# Patient Record
Sex: Male | Born: 1960 | Race: Black or African American | Hispanic: No | Marital: Married | Smoking: Current every day smoker
Health system: Southern US, Community
[De-identification: ages and names within clinical notes are randomized; demographics above are authoritative.]

## PROBLEM LIST (undated history)

## (undated) DIAGNOSIS — N182 Chronic kidney disease, stage 2 (mild): Secondary | ICD-10-CM

## (undated) DIAGNOSIS — I10 Essential (primary) hypertension: Secondary | ICD-10-CM

## (undated) DIAGNOSIS — E119 Type 2 diabetes mellitus without complications: Secondary | ICD-10-CM

## (undated) HISTORY — PX: CYST EXCISION: SHX5701

---

## 2018-02-27 ENCOUNTER — Encounter (HOSPITAL_COMMUNITY): Payer: Self-pay | Admitting: *Deleted

## 2018-02-27 ENCOUNTER — Emergency Department (HOSPITAL_COMMUNITY)
Admission: EM | Admit: 2018-02-27 | Discharge: 2018-02-27 | Disposition: A | Discharge status: Home / Self Care | Payer: BLUE CROSS/BLUE SHIELD | Attending: Emergency Medicine | Admitting: Emergency Medicine

## 2018-02-27 ENCOUNTER — Emergency Department (HOSPITAL_COMMUNITY): Payer: BLUE CROSS/BLUE SHIELD

## 2018-02-27 ENCOUNTER — Other Ambulatory Visit: Payer: Self-pay

## 2018-02-27 DIAGNOSIS — Y999 Unspecified external cause status: Secondary | ICD-10-CM | POA: Diagnosis not present

## 2018-02-27 DIAGNOSIS — E1122 Type 2 diabetes mellitus with diabetic chronic kidney disease: Secondary | ICD-10-CM | POA: Diagnosis not present

## 2018-02-27 DIAGNOSIS — F17210 Nicotine dependence, cigarettes, uncomplicated: Secondary | ICD-10-CM | POA: Insufficient documentation

## 2018-02-27 DIAGNOSIS — Y92411 Interstate highway as the place of occurrence of the external cause: Secondary | ICD-10-CM | POA: Diagnosis not present

## 2018-02-27 DIAGNOSIS — I129 Hypertensive chronic kidney disease with stage 1 through stage 4 chronic kidney disease, or unspecified chronic kidney disease: Secondary | ICD-10-CM | POA: Diagnosis not present

## 2018-02-27 DIAGNOSIS — E11649 Type 2 diabetes mellitus with hypoglycemia without coma: Secondary | ICD-10-CM | POA: Insufficient documentation

## 2018-02-27 DIAGNOSIS — Y9389 Activity, other specified: Secondary | ICD-10-CM | POA: Insufficient documentation

## 2018-02-27 DIAGNOSIS — E162 Hypoglycemia, unspecified: Secondary | ICD-10-CM

## 2018-02-27 DIAGNOSIS — N182 Chronic kidney disease, stage 2 (mild): Secondary | ICD-10-CM | POA: Diagnosis not present

## 2018-02-27 DIAGNOSIS — N289 Disorder of kidney and ureter, unspecified: Secondary | ICD-10-CM

## 2018-02-27 HISTORY — DX: Essential (primary) hypertension: I10

## 2018-02-27 HISTORY — DX: Chronic kidney disease, stage 2 (mild): N18.2

## 2018-02-27 HISTORY — DX: Type 2 diabetes mellitus without complications: E11.9

## 2018-02-27 LAB — CBG MONITORING, ED
GLUCOSE-CAPILLARY: 98 mg/dL (ref 70–99)
GLUCOSE-CAPILLARY: 83 mg/dL (ref 70–99)
GLUCOSE-CAPILLARY: 140 mg/dL — AB (ref 70–99)

## 2018-02-27 LAB — BASIC METABOLIC PANEL
Anion gap: 8 (ref 5–15)
BUN: 29 mg/dL — AB (ref 6–20)
CO2: 28 mmol/L (ref 22–32)
Calcium: 9.2 mg/dL (ref 8.9–10.3)
Chloride: 102 mmol/L (ref 98–111)
Creatinine, Ser: 2.25 mg/dL — ABNORMAL HIGH (ref 0.61–1.24)
GFR, EST AFRICAN AMERICAN: 36 mL/min — AB (ref >60)
GFR, EST NON AFRICAN AMERICAN: 31 mL/min — AB (ref >60)
Glucose, Bld: 92 mg/dL (ref 70–99)
POTASSIUM: 4 mmol/L (ref 3.5–5.1)
SODIUM: 138 mmol/L (ref 135–145)

## 2018-02-27 LAB — CBC WITH DIFFERENTIAL/PLATELET
BASOS ABS: 0 10*3/uL (ref 0.0–0.1)
Basophils Relative: 0 %
EOS ABS: 0.2 10*3/uL (ref 0.0–0.7)
EOS PCT: 2 %
HCT: 37.1 % — ABNORMAL LOW (ref 39.0–52.0)
HEMOGLOBIN: 12.6 g/dL — AB (ref 13.0–17.0)
LYMPHS ABS: 1.9 10*3/uL (ref 0.7–4.0)
LYMPHS PCT: 18 %
MCH: 25.4 pg — AB (ref 26.0–34.0)
MCHC: 34 g/dL (ref 30.0–36.0)
MCV: 74.6 fL — ABNORMAL LOW (ref 78.0–100.0)
Monocytes Absolute: 0.7 10*3/uL (ref 0.1–1.0)
Monocytes Relative: 7 %
NEUTROS PCT: 73 %
Neutro Abs: 8.2 10*3/uL — ABNORMAL HIGH (ref 1.7–7.7)
PLATELETS: 281 10*3/uL (ref 150–400)
RBC: 4.97 MIL/uL (ref 4.22–5.81)
RDW: 14.1 % (ref 11.5–15.5)
WBC: 11.1 10*3/uL — AB (ref 4.0–10.5)

## 2018-02-27 NOTE — Discharge Instructions (Addendum)
Follow-up with your doctor and follow with the nephrologist as planned.  You may need to decrease your insulin dose.  Your blood pressure is also high and will need to be followed
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
## 2018-02-27 NOTE — ED Notes (Signed)
Pt given snack (graham crackers and peanut butter). Meal tray unavailable from cafeteria at this time per nutrition. Meal tray will be brought up as soon as possible.
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
## 2019-07-18 LAB — HEMOGLOBIN A1C: Hemoglobin A1C: 11.3

## 2019-08-20 ENCOUNTER — Encounter: Payer: Self-pay | Admitting: "Endocrinology

## 2019-08-20 ENCOUNTER — Ambulatory Visit (INDEPENDENT_AMBULATORY_CARE_PROVIDER_SITE_OTHER): Payer: BC Managed Care – PPO | Admitting: "Endocrinology

## 2019-08-20 ENCOUNTER — Other Ambulatory Visit: Payer: Self-pay

## 2019-08-20 VITALS — BP 175/93 | HR 83 | Ht 72.0 in | Wt 215.0 lb

## 2019-08-20 DIAGNOSIS — Z794 Long term (current) use of insulin: Secondary | ICD-10-CM

## 2019-08-20 DIAGNOSIS — E1121 Type 2 diabetes mellitus with diabetic nephropathy: Secondary | ICD-10-CM | POA: Diagnosis not present

## 2019-08-20 DIAGNOSIS — N1831 Chronic kidney disease, stage 3a: Secondary | ICD-10-CM

## 2019-08-20 DIAGNOSIS — E782 Mixed hyperlipidemia: Secondary | ICD-10-CM | POA: Diagnosis not present

## 2019-08-20 DIAGNOSIS — I10 Essential (primary) hypertension: Secondary | ICD-10-CM | POA: Diagnosis not present

## 2019-08-20 MED ORDER — ACCU-CHEK GUIDE VI STRP: ORAL_STRIP | 2 refills | Status: AC

## 2019-08-20 MED ORDER — ACCU-CHEK GUIDE W/DEVICE KIT: 1.0000 | PACK | 0 refills | Status: AC

## 2019-08-20 NOTE — Progress Notes (Signed)
Endocrinology Consult Note       08/20/2019, 4:41 PM   Subjective:    Patient ID: Donald Pitts, male    DOB: Dec 05, 1960.  Jessen Siegman is being seen in consultation for management of currently uncontrolled symptomatic diabetes requested by  Patient, No Pcp Per.   Past Medical History:  Diagnosis Date  . Diabetes mellitus without complication (HCC)   . Hypertension   . 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 CYST EXCISION      Social History   Socioeconomic History  . Marital status: Married    Spouse name: Not on file  . Number of children: Not on file  . Years of education: Not on file  . Highest education level: Not on file  Occupational History  . Not on file  Tobacco Use  . Smoking status: Current Every Day Smoker    Packs/day: 0.30    Types: Cigarettes  . Smokeless tobacco: Never Used  Substance and Sexual Activity  . Alcohol use: Yes    Alcohol/week: 7.0 standard drinks    Types: 7 Cans of beer per week    Comment: 1 beer daily  . Drug use: Never  . Sexual activity: Not on file  Other Topics Concern  . Not on file  Social History Narrative  . Not on file   Social Determinants of Health   Financial Resource Strain:   . Difficulty of Paying Living Expenses: Not on file  Food Insecurity:   . Worried About Programme researcher, broadcasting/film/video in the Last Year: Not on file  . Ran Out of Food in the Last Year: Not on file  Transportation Needs:   . Lack of Transportation (Medical): Not on file  . Lack of Transportation (Non-Medical): Not on file  Physical Activity:   . Days of Exercise per Week: Not on file  . Minutes of Exercise per Session: Not on file  Stress:   . Feeling of Stress : Not on file  Social Connections:   . Frequency of Communication with Friends and Family: Not on file  . Frequency of Social Gatherings  with Friends and Family: Not on file  . Attends Religious Services: Not on file  . Active Member of Clubs or Organizations: Not on file  . Attends Banker Meetings: Not on file  . Marital Status: Not on file    History reviewed. No pertinent family history.  Outpatient Encounter Medications as of 08/20/2019  Medication Sig  . albuterol (ACCUNEB) 0.63 MG/3ML nebulizer solution Take 1 ampule by nebulization every 6 (six) hours as needed for wheezing.  Marland Kitchen amLODipine (NORVASC) 10 MG tablet Take 10 mg by mouth daily.  . clopidogrel (PLAVIX) 75 MG tablet Take 75 mg by mouth daily.  . fexofenadine (ALLEGRA) 180 MG tablet Take 180 mg by mouth daily.  . furosemide (LASIX) 20 MG tablet Take 20 mg by mouth 2 (two) times daily.  Marland Kitchen gabapentin (NEURONTIN) 300 MG capsule Take 300 mg by mouth 3 (three) times daily.  . hydrALAZINE (APRESOLINE) 25 MG tablet  Take 25 mg by mouth 3 (three) times daily.  . Insulin Detemir (LEVEMIR FLEXTOUCH) 100 UNIT/ML Pen Inject 50 Units into the skin at bedtime.  . montelukast (SINGULAIR) 10 MG tablet Take 10 mg by mouth at bedtime.  Marland Kitchen olmesartan (BENICAR) 5 MG tablet Take by mouth daily.  . rosuvastatin (CRESTOR) 20 MG tablet Take 20 mg by mouth daily.  . Vitamin D, Ergocalciferol, (DRISDOL) 1.25 MG (50000 UNIT) CAPS capsule Take 50,000 Units by mouth every 7 (seven) days.   No facility-administered encounter medications on file as of 08/20/2019.    ALLERGIES: No Known Allergies  VACCINATION STATUS:  There is no immunization history on file for this patient.  Diabetes He presents for his initial diabetic visit. He has type 2 diabetes mellitus. Onset time: He was diagnosed at approximate age of 32 years. His disease course has been worsening. There are no hypoglycemic associated symptoms. Pertinent negatives for hypoglycemia include no confusion, headaches, pallor or seizures. Associated symptoms include polydipsia and polyuria. Pertinent negatives for  diabetes include no chest pain, no fatigue, no polyphagia and no weakness. There are no hypoglycemic complications. Symptoms are worsening. Diabetic complications include heart disease, nephropathy, PVD and retinopathy. Risk factors for coronary artery disease include diabetes mellitus, dyslipidemia, family history, male sex, hypertension, obesity, tobacco exposure and sedentary lifestyle. Current diabetic treatment includes insulin injections. His weight is fluctuating minimally. He is following a generally unhealthy diet. When asked about meal planning, he reported none. He has not had a previous visit with a dietitian. He never participates in exercise. (He did not bring any logs nor meter to review.  His recent A1c was 11.3%. ) An ACE inhibitor/angiotensin II receptor blocker is being taken. Eye exam is current.  Hyperlipidemia This is a chronic problem. The current episode started more than 1 year ago. Exacerbating diseases include chronic renal disease and diabetes. Pertinent negatives include no chest pain, myalgias or shortness of breath. Risk factors for coronary artery disease include diabetes mellitus, dyslipidemia, family history, male sex, hypertension and a sedentary lifestyle.  Hypertension This is a chronic problem. The current episode started more than 1 year ago. The problem is uncontrolled. Pertinent negatives include no chest pain, headaches, neck pain, palpitations or shortness of breath. Risk factors for coronary artery disease include diabetes mellitus, dyslipidemia, family history, obesity, male gender, sedentary lifestyle and smoking/tobacco exposure. Past treatments include angiotensin blockers. Hypertensive end-organ damage includes heart failure, PVD and retinopathy. Identifiable causes of hypertension include chronic renal disease.     Review of Systems  Constitutional: Negative for chills, fatigue, fever and unexpected weight change.  HENT: Negative for dental problem, mouth  sores and trouble swallowing.   Eyes: Negative for visual disturbance.  Respiratory: Negative for cough, choking, chest tightness, shortness of breath and wheezing.   Cardiovascular: Negative for chest pain, palpitations and leg swelling.  Gastrointestinal: Negative for abdominal distention, abdominal pain, constipation, diarrhea, nausea and vomiting.  Endocrine: Positive for polydipsia and polyuria. Negative for polyphagia.  Genitourinary: Negative for dysuria, flank pain, hematuria and urgency.  Musculoskeletal: Negative for back pain, gait problem, myalgias and neck pain.  Skin: Negative for pallor, rash and wound.  Neurological: Negative for seizures, syncope, weakness, numbness and headaches.  Psychiatric/Behavioral: Negative for confusion and dysphoric mood.    Objective:    BP (!) 175/93   Pulse 83   Ht 6' (1.829 m)   Wt 215 lb (97.5 kg)   BMI 29.16 kg/m   Wt Readings from Last 3 Encounters:  08/20/19 215 lb (97.5 kg)  02/27/18 210 lb (95.3 kg)     Physical Exam Constitutional:      General: He is not in acute distress.    Appearance: He is well-developed.  HENT:     Head: Normocephalic and atraumatic.  Neck:     Thyroid: No thyromegaly.     Trachea: No tracheal deviation.  Cardiovascular:     Rate and Rhythm: Normal rate.     Pulses:          Dorsalis pedis pulses are 0 on the right side and 0 on the left side.       Posterior tibial pulses are 0 on the right side and 0 on the left side.     Heart sounds: S1 normal and S2 normal. No murmur. No gallop.   Pulmonary:     Effort: Pulmonary effort is normal. No respiratory distress.     Breath sounds: No wheezing.  Abdominal:     General: Bowel sounds are normal. There is no distension.     Palpations: Abdomen is soft.     Tenderness: There is no abdominal tenderness. There is no guarding.  Musculoskeletal:        General: Swelling present.     Right shoulder: No swelling or deformity.     Cervical back: Normal  range of motion and neck supple.     Right lower leg: Edema present.     Left lower leg: Edema present.  Skin:    General: Skin is warm and dry.     Findings: No rash.     Nails: There is no clubbing.  Neurological:     Mental Status: He is alert and oriented to person, place, and time.     Cranial Nerves: No cranial nerve deficit.     Sensory: No sensory deficit.     Gait: Gait normal.     Deep Tendon Reflexes: Reflexes are normal and symmetric.  Psychiatric:        Speech: Speech normal.        Behavior: Behavior normal. Behavior is cooperative.        Thought Content: Thought content normal.        Judgment: Judgment normal.      CMP ( most recent) CMP     Component Value Date/Time   NA 138 02/27/2018 1646   K 4.0 02/27/2018 1646   CL 102 02/27/2018 1646   CO2 28 02/27/2018 1646   GLUCOSE 92 02/27/2018 1646   BUN 29 (H) 02/27/2018 1646   CREATININE 2.25 (H) 02/27/2018 1646   CALCIUM 9.2 02/27/2018 1646   GFRNONAA 31 (L) 02/27/2018 1646   GFRAA 36 (L) 02/27/2018 1646     Diabetic Labs (most recent): Lab Results  Component Value Date   HGBA1C 11.3 07/18/2019    Assessment & Plan:   1. Type 2 diabetes mellitus with stage 3a chronic kidney disease, with long-term current use of insulin (HCC)   - Eliseo Withers has currently uncontrolled symptomatic type 2 DM since  59 years of age,  with most recent A1c of 11.3 %. Recent labs reviewed. - I had a long discussion with him about the progressive nature of diabetes and the pathology behind its complications. -his diabetes is complicated by CKD, CHF, peripheral arterial disease, retinopathy, sedentary life, smoking and he remains at a high risk for more acute and chronic complications which include CAD, CVA, CKD, retinopathy, and neuropathy. These are all discussed in detail with  him.  - I have counseled him on diet  and weight management  by adopting a carbohydrate restricted/protein rich diet. Patient is encouraged to  switch to  unprocessed or minimally processed     complex starch and increased protein intake (animal or plant source), fruits, and vegetables. -  he is advised to stick to a routine mealtimes to eat 3 meals  a day and avoid unnecessary snacks ( to snack only to correct hypoglycemia).   - he admits that there is a room for improvement in his food and drink choices. - Suggestion is made for him to avoid simple carbohydrates  from his diet including Cakes, Sweet Desserts, Ice Cream, Soda (diet and regular), Sweet Tea, Candies, Chips, Cookies, Store Bought Juices, Alcohol in Excess of  1-2 drinks a day, Artificial Sweeteners,  Coffee Creamer, and "Sugar-free" Products. This will help patient to have more stable blood glucose profile and potentially avoid unintended weight gain.  - he will be scheduled with Jearld Fenton, RDN, CDE for diabetes education.  - I have approached him with the following individualized plan to manage  his diabetes and patient agrees:   -Based on his current and prevailing glycemic burden, he will likely need intensive treatment with basal/bolus insulin in order for him to achieve and maintain control of diabetes to target. -In preparation, he is advised to increase his Levemir to 50 units nightly, associated with strict monitoring of glucose 4 times a day-before meals and at bedtime. - he is warned not to take insulin without proper monitoring per orders. -He is advised to return in 10 days with his meter and logs to assess for additional therapy. - he is encouraged to call clinic for blood glucose levels less than 70 or above 300 mg /dl.   - he is not a candidate for metformin, SGLT2 inhibitors due to concurrent renal insufficiency.  - he will be considered for incretin therapy as appropriate next visit.  - Specific targets for  A1c;  LDL, HDL,  and Triglycerides were discussed with the patient.  2) Blood Pressure /Hypertension:  his blood pressure is  uncontrolled to  target.   he is advised to continue his current medications including amlodipine 10 mg p.o. nightly, olmesartan 5 mg p.o. daily with breakfast . 3) Lipids/Hyperlipidemia: He does not have recent lipid panel to review.  He is advised to continue Crestor 20 mg p.o. nightly.   Side effects and precautions discussed with him.  4)  Weight/Diet:  Body mass index is 29.16 kg/m.  -   clearly complicating his diabetes care.   he is  a candidate for weight loss. I discussed with him the fact that loss of 5 - 10% of his  current body weight will have the most impact on his diabetes management.  Exercise, and detailed carbohydrates information provided  -  detailed on discharge instructions.  5) Chronic Care/Health Maintenance:  -he  is on ACEI/ARB and Statin medications and  is encouraged to initiate and continue to follow up with Ophthalmology, Dentist,  Podiatrist at least yearly or according to recommendations, and advised to   stay away from smoking. I have recommended yearly flu vaccine and pneumonia vaccine at least every 5 years; moderate intensity exercise for up to 150 minutes weekly; and  sleep for at least 7 hours a day.  - he is  advised to maintain close follow up with Patient, No Pcp Per for primary care needs, as well as his other providers for  optimal and coordinated care.   - Time spent in this patient care: 60 min, of which > 50% was spent in  counseling  him about his currently uncontrolled, complicated type 2 diabetes; hyperlipidemia; hypertension and the rest reviewing his blood glucose logs , discussing his hypoglycemia and hyperglycemia episodes, reviewing his current and  previous labs / studies  ( including abstraction from other facilities) and medications  doses and developing a  long term treatment plan based on the latest standards of care/ guidelines; and documenting his care.    Please refer to Patient Instructions for Blood Glucose Monitoring and Insulin/Medications Dosing Guide"   in media tab for additional information. Please  also refer to " Patient Self Inventory" in the Media  tab for reviewed elements of pertinent patient history.  Earleen ReaperBryan Rys participated in the discussions, expressed understanding, and voiced agreement with the above plans.  All questions were answered to his satisfaction. he is encouraged to contact clinic should he have any questions or concerns prior to his return visit.   Follow up plan: - Return in about 10 days (around 08/30/2019), or in person, for Follow up with Meter and Logs Only - no Labs.  Marquis LunchGebre Derald Lorge, MD [facility] [address] [place], [place] [address] Phone: [phone]  Fax: [phone]    08/20/2019, 4:41 PM  This note was partially dictated with voice recognition software. Similar sounding words can be transcribed inadequately or may not  be corrected upon review.

## 2019-08-20 NOTE — Patient Instructions (Signed)
                                     Advice for Weight Management  -For most of us the best way to lose weight is by diet management. Generally speaking, diet management means consuming less calories intentionally which over time brings about progressive weight loss.  This can be achieved more effectively by restricting carbohydrate consumption to the minimum possible.  So, it is critically important to know your numbers: how much calorie you are consuming and how much calorie you need. More importantly, our carbohydrates sources should be unprocessed or minimally processed complex starch food items.   Sometimes, it is important to balance nutrition by increasing protein intake (animal or plant source), fruits, and vegetables.  -Sticking to a routine mealtime to eat 3 meals a day and avoiding unnecessary snacks is shown to have a big role in weight control. Under normal circumstances, the only time we lose real weight is when we are hungry, so allow hunger to take place- hunger means no food between meal times, only water.  It is not advisable to starve.   -It is better to avoid simple carbohydrates including: Cakes, Sweet Desserts, Ice Cream, Soda (diet and regular), Sweet Tea, Candies, Chips, Cookies, Store Bought Juices, Alcohol in Excess of  1-2 drinks a day, Artificial Sweeteners, Doughnuts, Coffee Creamers, "Sugar-free" Products, etc, etc.  This is not a complete list.....    -Consulting with certified diabetes educators is proven to provide you with the most accurate and current information on diet.  Also, you may be  interested in discussing diet options/exchanges , we can schedule a visit with Penny Crumpton, RDN, CDE for individualized nutrition education.  -Exercise: If you are able: 30 -60 minutes a day ,4 days a week, or 150 minutes a week.  The longer the better.  Combine stretch, strength, and aerobic activities.  If you were told in the past that you  have high risk for cardiovascular diseases, you may seek evaluation by your heart doctor prior to initiating moderate to intense exercise programs.                                  Additional Care Considerations for Diabetes   -Diabetes  is a chronic disease.  The most important care consideration is regular follow-up with your diabetes care provider with the goal being avoiding or delaying its complications and to take advantage of advances in medications and technology.    -Type 2 diabetes is known to coexist with other important comorbidities such as high blood pressure and high cholesterol.  It is critical to control not only the diabetes but also the high blood pressure and high cholesterol to minimize and delay the risk of complications including coronary artery disease, stroke, amputations, blindness, etc.    - Studies showed that people with diabetes will benefit from a class of medications known as ACE inhibitors and statins.  Unless there are specific reasons not to be on these medications, the standard of care is to consider getting one from these groups of medications at an optimal doses.  These medications are generally considered safe and proven to help protect the heart and the kidneys.    - People with diabetes are encouraged to initiate and maintain regular follow-up with eye doctors, foot doctors, dentists ,   and if necessary heart and kidney doctors.     - It is highly recommended that people with diabetes quit smoking or stay away from smoking, and get yearly  flu vaccine and pneumonia vaccine at least every 5 years.  One other important lifestyle recommendation is to ensure adequate sleep - at least 6-7 hours of uninterrupted sleep at night.  -Exercise: If you are able: 30 -60 minutes a day, 4 days a week, or 150 minutes a week.  The longer the better.  Combine stretch, strength, and aerobic activities.  If you were told in the past that you have high risk for cardiovascular  diseases, you may seek evaluation by your heart doctor prior to initiating moderate to intense exercise programs.     COVID-19 Vaccine Information can be found at: [email or web address] For questions related to vaccine distribution or appointments, please email [email or web address] or call [phone]        

## 2019-09-02 ENCOUNTER — Ambulatory Visit: Payer: BC Managed Care – PPO | Admitting: "Endocrinology
